# Patient Record
Sex: Female | Born: 1970 | Smoking: Never smoker
Health system: Southern US, Community
[De-identification: ages and names within clinical notes are randomized; demographics above are authoritative.]

## PROBLEM LIST (undated history)

## (undated) DIAGNOSIS — F32A Depression, unspecified: Secondary | ICD-10-CM

## (undated) DIAGNOSIS — F329 Major depressive disorder, single episode, unspecified: Secondary | ICD-10-CM

## (undated) DIAGNOSIS — F419 Anxiety disorder, unspecified: Secondary | ICD-10-CM

## (undated) DIAGNOSIS — D6851 Activated protein C resistance: Secondary | ICD-10-CM

## (undated) HISTORY — PX: BREAST SURGERY: SHX581

---

## 2018-03-07 ENCOUNTER — Other Ambulatory Visit: Payer: Self-pay | Admitting: Gastroenterology

## 2018-03-07 ENCOUNTER — Ambulatory Visit
Admission: RE | Admit: 2018-03-07 | Discharge: 2018-03-07 | Disposition: A | Discharge status: Home / Self Care | Payer: Managed Care, Other (non HMO) | Source: Ambulatory Visit | Attending: Gastroenterology | Admitting: Gastroenterology

## 2018-03-07 DIAGNOSIS — T18108A Unspecified foreign body in esophagus causing other injury, initial encounter: Secondary | ICD-10-CM

## 2018-03-07 MED ORDER — IOPAMIDOL (ISOVUE-300) INJECTION 61%
75.0000 mL | Freq: Once | INTRAVENOUS | Status: AC | PRN
  Administered 2018-03-07: 75 mL via INTRAVENOUS

## 2018-03-09 ENCOUNTER — Ambulatory Visit (HOSPITAL_COMMUNITY): Payer: Managed Care, Other (non HMO) | Admitting: Certified Registered Nurse Anesthetist

## 2018-03-09 ENCOUNTER — Ambulatory Visit (HOSPITAL_COMMUNITY)
Admission: RE | Admit: 2018-03-09 | Discharge: 2018-03-09 | Disposition: A | Discharge status: Home / Self Care | Payer: Managed Care, Other (non HMO) | Attending: Gastroenterology | Admitting: Gastroenterology

## 2018-03-09 ENCOUNTER — Encounter (HOSPITAL_COMMUNITY): Payer: Self-pay | Admitting: *Deleted

## 2018-03-09 ENCOUNTER — Encounter (HOSPITAL_COMMUNITY)
Admission: RE | Disposition: A | Discharge status: Home / Self Care | Payer: Self-pay | Source: Home / Self Care | Attending: Gastroenterology

## 2018-03-09 ENCOUNTER — Other Ambulatory Visit: Payer: Self-pay

## 2018-03-09 DIAGNOSIS — Z79899 Other long term (current) drug therapy: Secondary | ICD-10-CM | POA: Insufficient documentation

## 2018-03-09 DIAGNOSIS — X58XXXA Exposure to other specified factors, initial encounter: Secondary | ICD-10-CM | POA: Insufficient documentation

## 2018-03-09 DIAGNOSIS — K319 Disease of stomach and duodenum, unspecified: Secondary | ICD-10-CM | POA: Diagnosis not present

## 2018-03-09 DIAGNOSIS — T18128A Food in esophagus causing other injury, initial encounter: Secondary | ICD-10-CM | POA: Diagnosis present

## 2018-03-09 DIAGNOSIS — D6851 Activated protein C resistance: Secondary | ICD-10-CM | POA: Diagnosis not present

## 2018-03-09 DIAGNOSIS — F418 Other specified anxiety disorders: Secondary | ICD-10-CM | POA: Insufficient documentation

## 2018-03-09 DIAGNOSIS — K210 Gastro-esophageal reflux disease with esophagitis: Secondary | ICD-10-CM | POA: Insufficient documentation

## 2018-03-09 HISTORY — DX: Major depressive disorder, single episode, unspecified: F32.9

## 2018-03-09 HISTORY — PX: ESOPHAGOGASTRODUODENOSCOPY (EGD) WITH PROPOFOL: SHX5813

## 2018-03-09 HISTORY — PX: BIOPSY: SHX5522

## 2018-03-09 HISTORY — DX: Anxiety disorder, unspecified: F41.9

## 2018-03-09 HISTORY — DX: Activated protein C resistance: D68.51

## 2018-03-09 HISTORY — DX: Depression, unspecified: F32.A

## 2018-03-09 LAB — PREGNANCY, URINE: PREG TEST UR: NEGATIVE

## 2018-03-09 SURGERY — ESOPHAGOGASTRODUODENOSCOPY (EGD) WITH PROPOFOL
Anesthesia: Monitor Anesthesia Care

## 2018-03-09 MED ORDER — PROPOFOL 500 MG/50ML IV EMUL
INTRAVENOUS | Status: DC | PRN
  Administered 2018-03-09: 100 ug/kg/min via INTRAVENOUS

## 2018-03-09 MED ORDER — LACTATED RINGERS IV SOLN
INTRAVENOUS | Status: DC
  Administered 2018-03-09: 1000 mL via INTRAVENOUS

## 2018-03-09 MED ORDER — SODIUM CHLORIDE 0.9 % IV SOLN: INTRAVENOUS | Status: DC

## 2018-03-09 MED ORDER — PROPOFOL 10 MG/ML IV BOLUS
INTRAVENOUS | Status: DC | PRN
  Administered 2018-03-09: 40 mg via INTRAVENOUS
  Administered 2018-03-09: 30 mg via INTRAVENOUS
  Administered 2018-03-09: 20 mg via INTRAVENOUS
  Administered 2018-03-09: 40 mg via INTRAVENOUS
  Administered 2018-03-09: 30 mg via INTRAVENOUS
  Administered 2018-03-09: 40 mg via INTRAVENOUS

## 2018-03-09 MED ORDER — PROPOFOL 10 MG/ML IV BOLUS
INTRAVENOUS | Status: AC
  Filled 2018-03-09: qty 60

## 2018-03-09 SURGICAL SUPPLY — 15 items

## 2018-03-09 NOTE — Anesthesia Postprocedure Evaluation (Signed)
Anesthesia Post Note  Patient: Nicole Fitzpatrick  Procedure(s) Performed: ESOPHAGOGASTRODUODENOSCOPY (EGD) WITH PROPOFOL (N/A ) BIOPSY     Patient location during evaluation: PACU Anesthesia Type: MAC Level of consciousness: awake and alert Pain management: pain level controlled Vital Signs Assessment: post-procedure vital signs reviewed and stable Respiratory status: spontaneous breathing, nonlabored ventilation, respiratory function stable and patient connected to nasal cannula oxygen Cardiovascular status: stable and blood pressure returned to baseline Postop Assessment: no apparent nausea or vomiting Anesthetic complications: no    Last Vitals:  Vitals:   03/09/18 1234 03/09/18 1248  BP: (!) 114/53 (!) 122/52  Pulse: 84 76  Resp: 20 17  Temp: 36.5 C   SpO2: 100% 100%    Last Pain:  Vitals:   03/09/18 1248  TempSrc:   PainSc: 0-No pain                 Effie Berkshire

## 2018-03-09 NOTE — Transfer of Care (Signed)
Immediate Anesthesia Transfer of Care Note  Patient: Nicole Fitzpatrick  Procedure(s) Performed: ESOPHAGOGASTRODUODENOSCOPY (EGD) WITH PROPOFOL (N/A ) BIOPSY  Patient Location: Endoscopy Unit  Anesthesia Type:MAC  Level of Consciousness: drowsy and patient cooperative  Airway & Oxygen Therapy: Patient Spontanous Breathing and Patient connected to nasal cannula oxygen  Post-op Assessment: Report given to RN and Post -op Vital signs reviewed and stable  Post vital signs: Reviewed and stable  Last Vitals:  Vitals Value Taken Time  BP    Temp    Pulse    Resp 27 03/09/2018 12:32 PM  SpO2    Vitals shown include unvalidated device data.  Last Pain:  Vitals:   03/09/18 1050  TempSrc: Oral  PainSc: 0-No pain         Complications: No apparent anesthesia complications

## 2018-03-09 NOTE — Discharge Instructions (Signed)

## 2018-03-09 NOTE — Anesthesia Preprocedure Evaluation (Addendum)
Anesthesia Evaluation  Patient identified by MRN, date of birth, ID band Patient awake    Reviewed: Allergy & Precautions, NPO status , Patient's Chart, lab work & pertinent test results  Airway Mallampati: I  TM Distance: >3 FB Neck ROM: Full    Dental  (+) Teeth Intact, Dental Advisory Given   Pulmonary neg pulmonary ROS,    breath sounds clear to auscultation       Cardiovascular negative cardio ROS   Rhythm:Regular Rate:Normal     Neuro/Psych Anxiety Depression negative neurological ROS     GI/Hepatic Neg liver ROS, GERD  ,  Endo/Other  negative endocrine ROS  Renal/GU negative Renal ROS     Musculoskeletal negative musculoskeletal ROS (+)   Abdominal Normal abdominal exam  (+)   Peds  Hematology negative hematology ROS (+)   Anesthesia Other Findings   Reproductive/Obstetrics                            Anesthesia Physical Anesthesia Plan  ASA: II  Anesthesia Plan: MAC   Post-op Pain Management:    Induction: Intravenous  PONV Risk Score and Plan: 0 and Propofol infusion  Airway Management Planned: Natural Airway and Nasal Cannula  Additional Equipment: None  Intra-op Plan:   Post-operative Plan:   Informed Consent:   Plan Discussed with: CRNA  Anesthesia Plan Comments:         Anesthesia Quick Evaluation

## 2018-03-09 NOTE — Op Note (Signed)
[facility] Patient Name: Nicole Fitzpatrick Procedure Date: 03/09/2018 MRN: [id number] Attending MD: [name] , MD Date of Birth: 1970/09/06 CSN: [id number] Age: 48 Admit Type: Outpatient Procedure:                Upper GI endoscopy Indications:              Foreign body in the esophagus, abdominal bloating,                            GERD Providers:                [name], MD, Margaree Mackintosh, RN,                            Beryle Beams, Technician, Milinda Cave, CRNA Referring MD:              Medicines:                Sedation Administered by an Anesthesia Professional Complications:            No immediate complications. Estimated Blood Loss:     Estimated blood loss was minimal. Procedure:                Pre-Anesthesia Assessment:                           - Prior to the procedure, a History and Physical                            was performed, and patient medications and                            allergies were reviewed. The patient's tolerance of                            previous anesthesia was also reviewed. The risks                            and benefits of the procedure and the sedation                            options and risks were discussed with the patient.                            All questions were answered, and informed consent                            was obtained. Prior Anticoagulants: The patient has                            taken no previous anticoagulant or antiplatelet                            agents. ASA Grade Assessment: II - A patient with  mild systemic disease. After reviewing the risks                            and benefits, the patient was deemed in                            satisfactory condition to undergo the procedure.                           After obtaining informed consent, the endoscope was                            passed under direct vision. Throughout the                         procedure, the patient's blood pressure, pulse, and                            oxygen saturations were monitored continuously. The                            GIF-H190 ([id number] Olympus gastroscope was                            introduced through the mouth, and advanced to the                            second part of duodenum. The upper GI endoscopy was                            accomplished without difficulty. The patient                            tolerated the procedure well. Scope In: Scope Out: Findings:      The examined portions of the oropharynx and larynx were normal.      There is no endoscopic evidence of Foreign body in the entire esophagus.      The Z-line was regular and was found 39 cm from the incisors.      Normal mucosa was found in the entire esophagus. Biopsies were obtained       from the proximal and distal esophagus with cold forceps for histology       of suspected eosinophilic esophagitis.      Normal mucosa was found in the entire examined stomach. Biopsies were       taken with a cold forceps for histology.      The cardia and gastric fundus were normal on retroflexion.      The duodenal bulb, first portion of the duodenum and second portion of       the duodenum were normal. Biopsies for histology were taken with a cold       forceps for evaluation of celiac disease. Impression:               - The examined portions of the nasopharynx,  oropharynx and larynx were normal.                           - Z-line regular, 39 cm from the incisors.                           - Normal mucosa was found in the entire esophagus.                            Biopsied.                           - Normal mucosa was found in the entire stomach.                            Biopsied.                           - Normal duodenal bulb, first portion of the                            duodenum and second portion of the duodenum.                             Biopsied. Moderate Sedation:      Moderate (conscious) sedation was personally administered by an       anesthesia professional. The following parameters were monitored: oxygen       saturation, heart rate, blood pressure, and response to care. Recommendation:           - Patient has a contact number available for                            emergencies. The signs and symptoms of potential                            delayed complications were discussed with the                            patient. Return to normal activities tomorrow.                            Written discharge instructions were provided to the                            patient.                           - Resume previous diet.                           - Continue present medications.                           - Await pathology results.                           -  Return to GI clinic PRN. Procedure Code(s):        --- Professional ---                           [phone], Esophagogastroduodenoscopy, flexible,                            transoral; with biopsy, single or multiple Diagnosis Code(s):        --- Professional ---                           T18.108A, Unspecified foreign body in esophagus                            causing other injury, initial encounter CPT copyright 2018 American Medical Association. All rights reserved. The codes documented in this report are preliminary and upon coder review may  be revised to meet current compliance requirements. [name], MD [name], MD 03/09/2018 12:31:49 PM Number of Addenda: 0

## 2018-03-09 NOTE — H&P (Signed)
Nicole Fitzpatrick is a 48 y.o. female was seen in the office by Dr. Randa Evens for possible foreign body in the esophagus.  CT neck was negative for any foreign body or any other abnormality.  She is here to undergo EGD for further evaluation.  Please see  full H&P from office note for details.  The various methods of treatment have been discussed with the patient and family. After consideration of risks, benefits and other options for treatment, the patient has consented to  Procedure(s): Esophago-duodenoscopy  as a surgical intervention .  The patient's history has been reviewed, patient examined, no change in status, stable for surgery.  I have reviewed the patient's chart and labs.  Questions were answered to the patient's satisfaction.   Risks (bleeding, infection, bowel perforation that could require surgery, sedation-related changes in cardiopulmonary systems), benefits (identification and possible treatment of source of symptoms, exclusion of certain causes of symptoms), and alternatives (watchful waiting, radiographic imaging studies, empiric medical treatment)  were explained to patien in detail and patient wishes to proceed.   Nicole Der MD, FACP 03/09/2018, 10:40 AM  Contact #  [phone]

## 2018-03-12 ENCOUNTER — Encounter (HOSPITAL_COMMUNITY): Payer: Self-pay | Admitting: Gastroenterology

## 2018-03-27 ENCOUNTER — Ambulatory Visit: Payer: Self-pay | Admitting: Family Medicine

## 2019-01-05 ENCOUNTER — Other Ambulatory Visit: Payer: Self-pay

## 2019-01-05 ENCOUNTER — Ambulatory Visit
Admission: EM | Admit: 2019-01-05 | Discharge: 2019-01-05 | Disposition: A | Discharge status: Home / Self Care | Payer: Managed Care, Other (non HMO) | Attending: Emergency Medicine | Admitting: Emergency Medicine

## 2019-01-05 ENCOUNTER — Encounter: Payer: Self-pay | Admitting: Emergency Medicine

## 2019-01-05 DIAGNOSIS — Z20828 Contact with and (suspected) exposure to other viral communicable diseases: Secondary | ICD-10-CM

## 2019-01-05 DIAGNOSIS — R519 Headache, unspecified: Secondary | ICD-10-CM | POA: Diagnosis not present

## 2019-01-05 DIAGNOSIS — R5383 Other fatigue: Secondary | ICD-10-CM

## 2019-01-05 DIAGNOSIS — Z20822 Contact with and (suspected) exposure to covid-19: Secondary | ICD-10-CM

## 2019-01-05 MED ORDER — DEXAMETHASONE SODIUM PHOSPHATE 10 MG/ML IJ SOLN
10.0000 mg | Freq: Once | INTRAMUSCULAR | Status: AC
  Administered 2019-01-05: 14:00:00 10 mg via INTRAMUSCULAR

## 2019-01-05 MED ORDER — KETOROLAC TROMETHAMINE 30 MG/ML IJ SOLN
30.0000 mg | Freq: Once | INTRAMUSCULAR | Status: AC
  Administered 2019-01-05: 14:00:00 30 mg via INTRAMUSCULAR

## 2019-01-05 NOTE — ED Triage Notes (Signed)
Pt presents to [facility] for assessment of generalized fatigue, headaches that weren't relieved with motrin.  Pt c/o "heaviness" in throat and chest.  Patient states she also found out today she had a COVID exposure at work this week, unsure of who, not without a mask.

## 2019-01-05 NOTE — ED Notes (Signed)
Patient able to ambulate independently  

## 2019-01-05 NOTE — Discharge Instructions (Signed)
Your COVID test is pending - it is important to quarantine / isolate at home until your results are back. °If you test positive and would like further evaluation for persistent or worsening symptoms, you may schedule an E-visit or virtual (video) visit throughout the [facility] MyChart app or website. ° °PLEASE NOTE: If you develop severe chest pain or shortness of breath please go to the ER or call 9-1-1 for further evaluation --> DO NOT schedule electronic or virtual visits for this. °Please call our office for further guidance / recommendations as needed. °

## 2019-01-05 NOTE — ED Provider Notes (Signed)
[facility]    CSN: [id number] Arrival date & time: 01/05/19  1318      History   Chief Complaint Chief Complaint  Patient presents with  . Fatigue    HPI Billie RuddyMelanie Gonnella is a 48 y.o. 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  Patient has tried ibuprofen for headache without significant relief.  States is frontal, pressure-like, feels like her normal.  No change in vision, facial weakness, nausea, vomiting.   Past Medical History:  Diagnosis Date  . Anxiety   . Depression   . Factor V Leiden (HCC)     There are no problems to display for this patient.   Past Surgical History:  Procedure Laterality Date  . BIOPSY  03/09/2018   Procedure: BIOPSY;  Surgeon: Kathi DerBrahmbhatt, Parag, MD;  Location: [facility];  Service: Gastroenterology;;  . BREAST SURGERY Bilateral    Aumentation  . CESAREAN SECTION    . ESOPHAGOGASTRODUODENOSCOPY (EGD) WITH PROPOFOL N/A 03/09/2018   Procedure: ESOPHAGOGASTRODUODENOSCOPY (EGD) WITH PROPOFOL;  Surgeon: Kathi DerBrahmbhatt, Parag, MD;  Location: [facility];  Service: Gastroenterology;  Laterality: N/A;    OB History   No obstetric history on file.      Home Medications    Prior to Admission medications   Medication Sig Start Date End Date Taking? Authorizing Provider  ibuprofen (ADVIL,MOTRIN) 200 MG tablet Take 400 mg by mouth every 6 (six) hours as needed.    [provider]  pantoprazole (PROTONIX) 40 MG tablet Take 40 mg by mouth daily.    [provider]  Probiotic Product (PROBIOTIC DAILY PO) Take 1 capsule by mouth daily.    [provider]  sertraline (ZOLOFT) 50 MG tablet Take 50 mg by mouth daily.     [provider]    Family History Family History  Problem Relation Age of Onset  . Breast cancer Mother   . Thyroid cancer Mother   . Hypertension Mother   . Leukemia Father     Social History Social History   Tobacco Use  . Smoking status: Never Smoker  . Smokeless tobacco: Never Used  Substance Use Topics  . Alcohol use: Yes    Comment: occasionally  . Drug use: Never     Allergies   Sulfa antibiotics   Review of Systems Review of Systems  Constitutional: Positive for fatigue. Negative for fever.  HENT: Positive for sore throat. Negative for ear pain, sinus pain and voice change.   Eyes: Negative for pain, redness and visual disturbance.  Respiratory: Negative for cough and shortness of breath.   Cardiovascular: Negative for chest pain and palpitations.  Gastrointestinal: Negative for abdominal pain, diarrhea and vomiting.  Musculoskeletal: Negative for arthralgias and myalgias.  Skin: Negative for rash and wound.  Neurological: Positive for headaches. Negative for dizziness, syncope, facial asymmetry and light-headedness.     Physical Exam Triage Vital Signs ED Triage Vitals  Enc Vitals Group     BP      Pulse      Resp      Temp      Temp src      SpO2      Weight      Height      Head Circumference  Peak Flow      Pain Score      Pain Loc      Pain Edu?      Excl. in GC?    No data found.  Updated Vital Signs BP 139/84 (BP Location: Left Arm)   Pulse 84   Temp 98.1 F (36.7 C) (Temporal)   Resp 16   LMP 12/24/2018   SpO2 97%   Visual Acuity Right Eye Distance:   Left Eye Distance:   Bilateral Distance:    Right Eye Near:   Left Eye Near:    Bilateral Near:     Physical Exam Constitutional:      General: She is not in acute distress.    Appearance: She is obese. She is not ill-appearing.  HENT:     Head: Normocephalic and atraumatic.     Mouth/Throat:     Mouth: Mucous membranes are moist.     Pharynx:  Oropharynx is clear. No oropharyngeal exudate or posterior oropharyngeal erythema.     Comments: Uvula midline, nonedematous Eyes:     General: No scleral icterus.    Conjunctiva/sclera: Conjunctivae normal.     Pupils: Pupils are equal, round, and reactive to light.  Cardiovascular:     Rate and Rhythm: Normal rate.  Pulmonary:     Effort: Pulmonary effort is normal. No respiratory distress.     Breath sounds: No wheezing.  Musculoskeletal:     Cervical back: Normal range of motion and neck supple.  Lymphadenopathy:     Cervical: No cervical adenopathy.  Skin:    Capillary Refill: Capillary refill takes less than 2 seconds.     Coloration: Skin is not jaundiced or pale.  Neurological:     General: No focal deficit present.     Mental Status: She is alert and oriented to person, place, and time.      UC Treatments / Results  Labs (all labs ordered are listed, but only abnormal results are displayed) Labs Reviewed  NOVEL CORONAVIRUS, NAA    EKG   Radiology No results found.  Procedures Procedures (including critical care time)  Medications Ordered in UC Medications  dexamethasone (DECADRON) injection 10 mg (10 mg Intramuscular Given 01/05/19 1355)  ketorolac (TORADOL) 30 MG/ML injection 30 mg (30 mg Intramuscular Given 01/05/19 1355)    Initial Impression / Assessment and Plan / UC Course  I have reviewed the triage vital signs and the nursing notes.  Pertinent labs & imaging results that were available during my care of the patient were reviewed by me and considered in my medical decision making (see chart for details).     Patient afebrile, nontoxic, with SpO2 97%.  No neurocognitive deficits on exam, low concern for acute bleed at this time: Patient given IM Solu-Medrol and Toradol in office which she tolerated well for her headache.  Covid PCR pending.  Patient to quarantine until results are back.  We will continue supportive management.  Return precautions  discussed, patient verbalized understanding and is agreeable to plan. Final Clinical Impressions(s) / UC Diagnoses   Final diagnoses:  Exposure to COVID-19 virus  Fatigue, unspecified type     Discharge Instructions     Your COVID test is pending - it is important to quarantine / isolate at home until your results are back. If you test positive and would like further evaluation for persistent or worsening symptoms, you may schedule an E-visit or virtual (video) visit throughout the [facility] app or website.  PLEASE NOTE: If you develop severe chest pain or shortness of breath please go to the ER or call 9-1-1 for further evaluation --> DO NOT schedule electronic or virtual visits for this. Please call our office for further guidance / recommendations as needed.    ED Prescriptions    None     PDMP not reviewed this encounter.   Hall[place]vin, [place], [place] 01/05/19 1408

## 2019-01-06 LAB — NOVEL CORONAVIRUS, NAA: SARS-CoV-2, NAA: NOT DETECTED

## 2019-12-04 IMAGING — CT CT NECK W/ CM
3 of 12 series · 11 of 33 positions shown, 13 images · IV contrast (iopamidol)
Comparison: None.

CLINICAL DATA: Possible swallowed foreign body (chicken bone) 1
week ago with continued neck pain.

EXAM:
CT NECK WITH CONTRAST
TECHNIQUE: Multidetector CT imaging of the neck was performed using the
standard protocol following the bolus administration of intravenous
contrast.
CONTRAST:  75mL AR2UGI-3KK IOPAMIDOL (AR2UGI-3KK) INJECTION 61%

[Series 12: neck 2.00 br36 s3 (person_name) · axial · 0.46mm/px · z∈[-670,-522]mm · 3 of 154 slices shown, 4 images]
[im 1/154  soft-tissue]
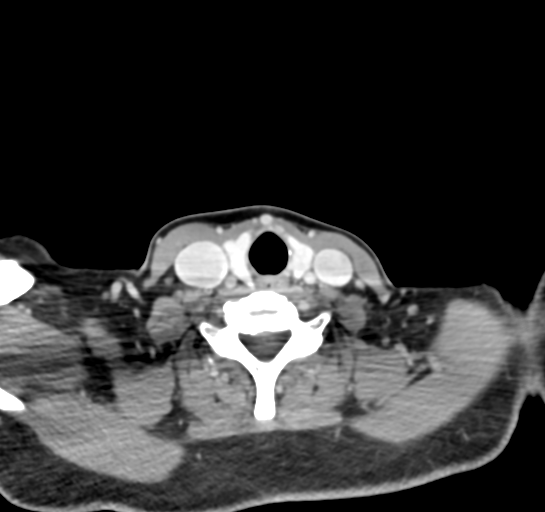
[im 1/154  bone]
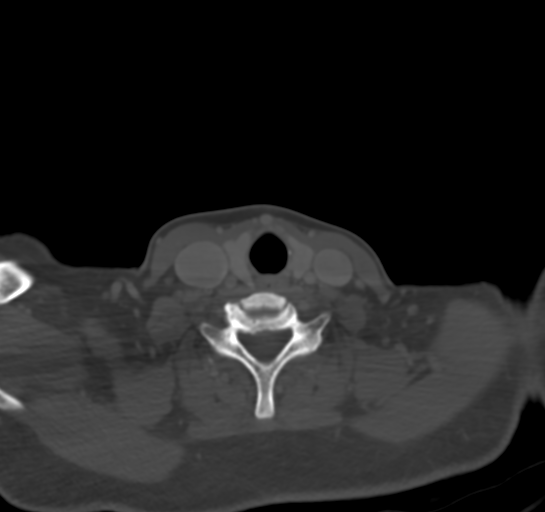
[im 77/154  bone]
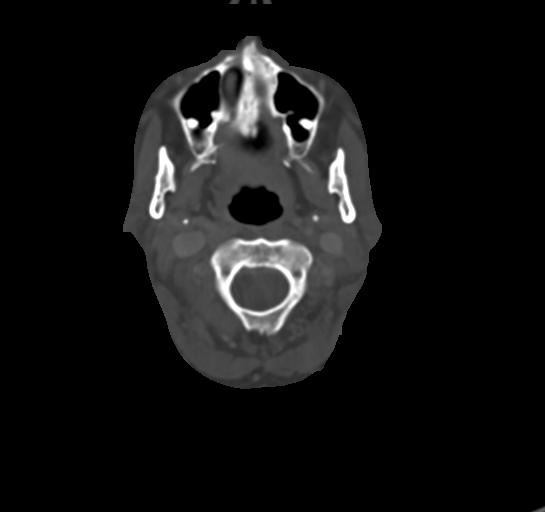
[im 154/154  bone]
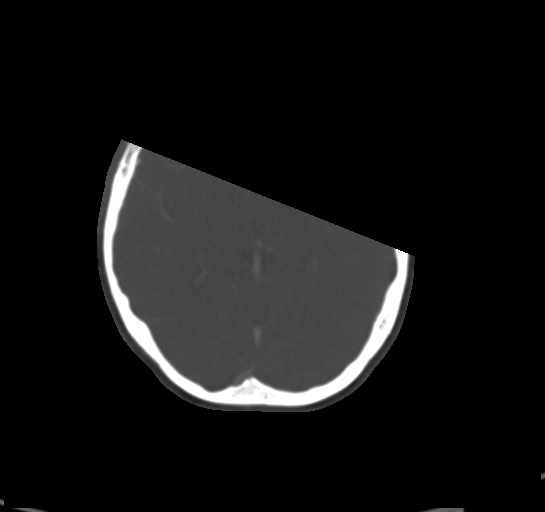

[Series 22: neck 2.00 br40 s3 (person_name) · coronal · 0.35mm/px · 3 of 126 slices shown]
[im 26/126  bone]
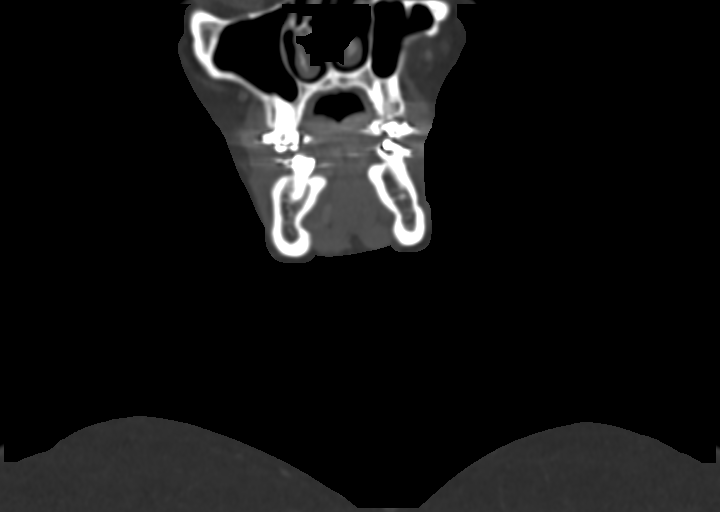
[im 51/126  bone]
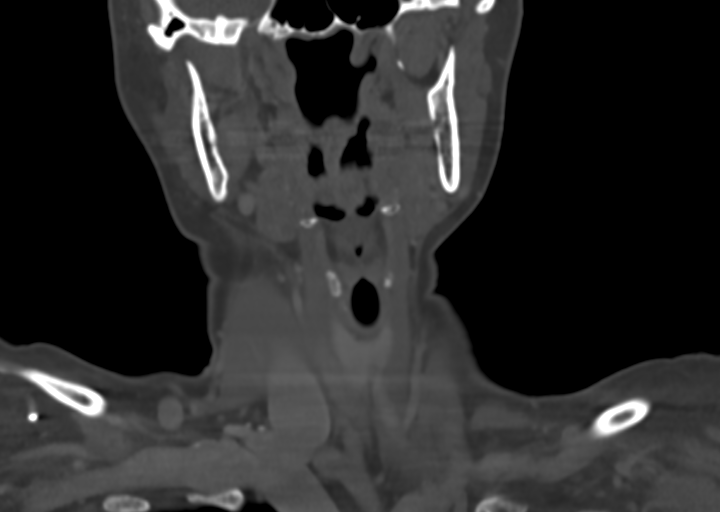
[im 76/126  bone]
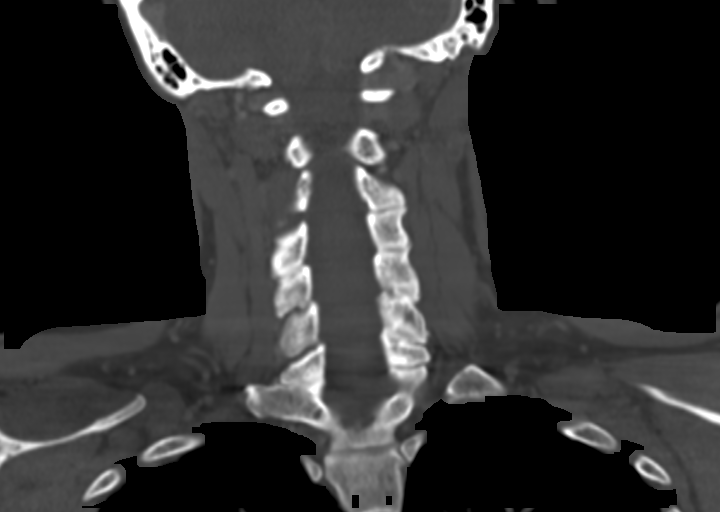

[Series 24: neck 2.00 br40 s3 sag st · sagittal · 0.35mm/px · 5 of 126 slices shown, 6 images]
[im 42/126  bone]
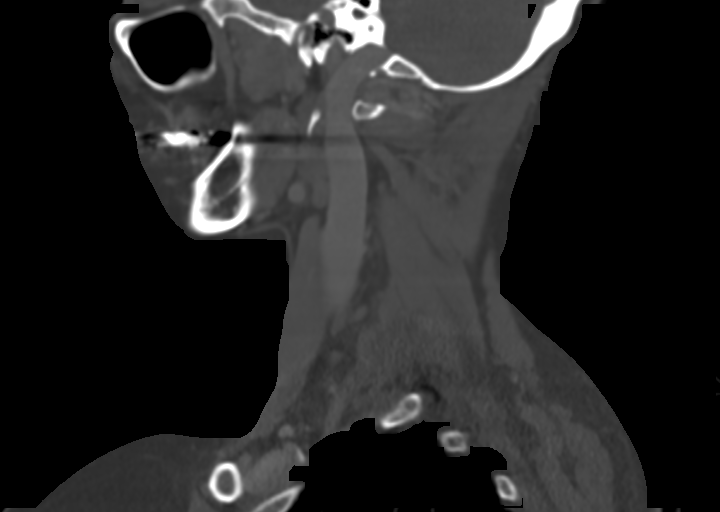
[im 53/126  bone]
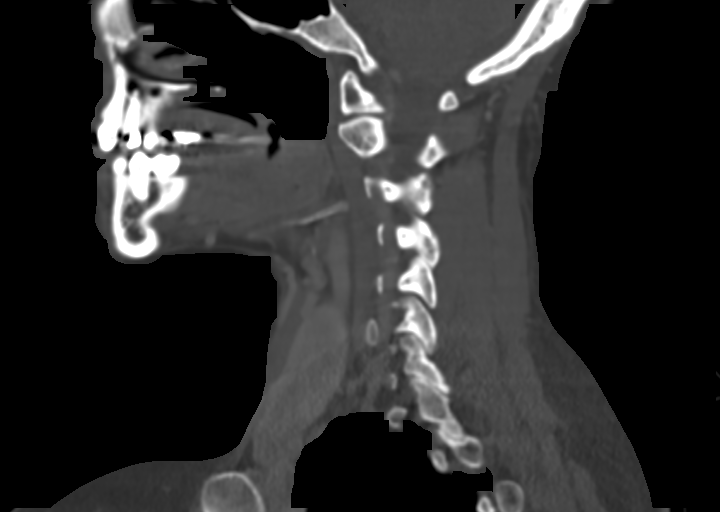
[im 63/126  soft-tissue]
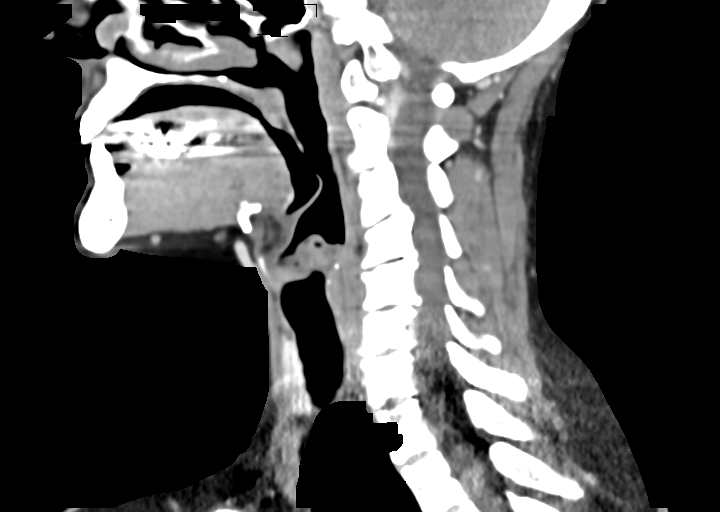
[im 63/126  bone]
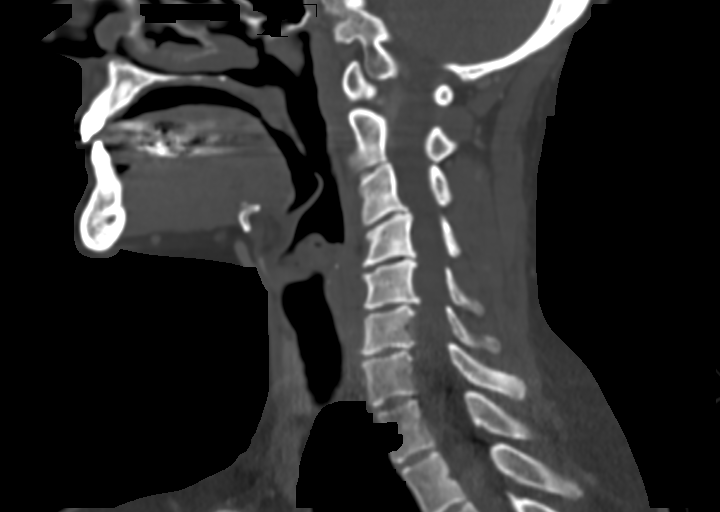
[im 73/126  bone]
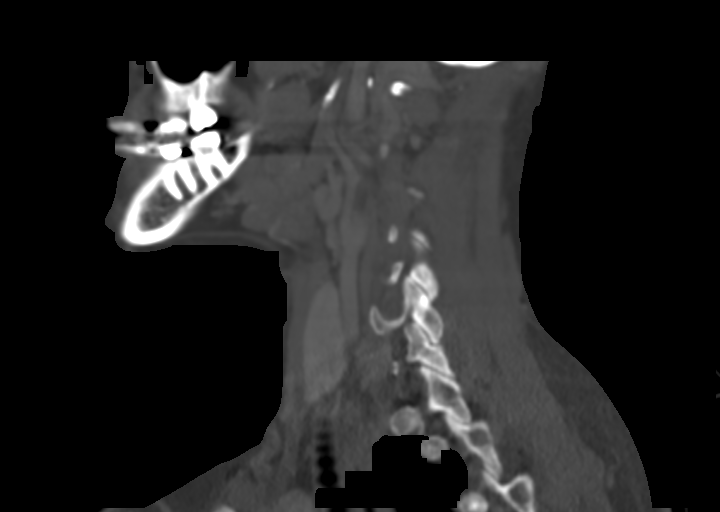
[im 84/126  bone]
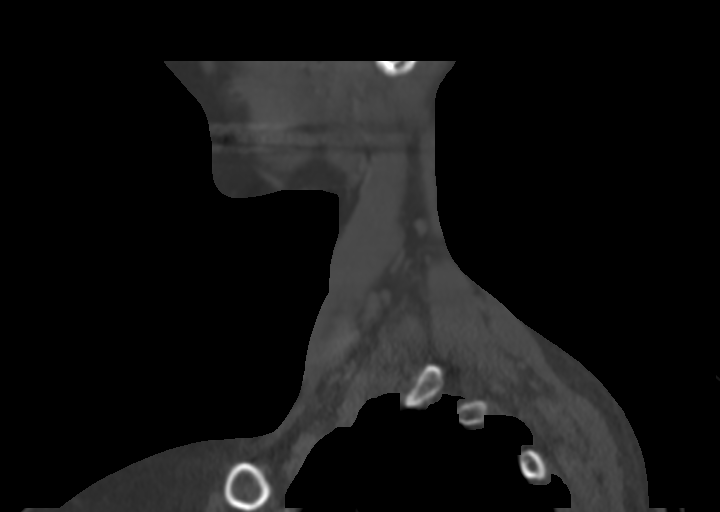

[11 of 33 positions shown; findings below may reference images not displayed]

FINDINGS: Pharynx and larynx: No evidence of mass, swelling, or foreign body.
No fluid collection or significant inflammatory changes in the
parapharyngeal or retropharyngeal spaces.

Salivary glands: No inflammation, mass, or stone.

Thyroid: Unremarkable.

Lymph nodes: No enlarged or suspicious lymph nodes in the neck.

Vascular: Major vascular structures of the neck are patent.

Limited intracranial: Unremarkable.

Visualized orbits: Unremarkable.

Mastoids and visualized paranasal sinuses: Clear.

Skeleton: Disc degeneration in the mid and lower cervical spine with
uncovertebral spurring resulting in multilevel neural foraminal
stenosis, severe at C5-6 and C6-7.

Upper chest: Clear lung apices. Partially visualized breast
implants.

Other: None.
IMPRESSION: No foreign body or other acute abnormality identified in the neck.

## 2020-06-24 ENCOUNTER — Other Ambulatory Visit: Payer: Self-pay | Admitting: Physician Assistant

## 2020-06-24 DIAGNOSIS — R1030 Lower abdominal pain, unspecified: Secondary | ICD-10-CM

## 2020-07-09 ENCOUNTER — Ambulatory Visit
Admission: RE | Admit: 2020-07-09 | Discharge: 2020-07-09 | Disposition: A | Discharge status: Home / Self Care | Payer: Managed Care, Other (non HMO) | Source: Ambulatory Visit | Attending: Physician Assistant | Admitting: Physician Assistant

## 2020-07-09 DIAGNOSIS — R1030 Lower abdominal pain, unspecified: Secondary | ICD-10-CM

## 2020-07-09 MED ORDER — IOPAMIDOL (ISOVUE-300) INJECTION 61%
100.0000 mL | Freq: Once | INTRAVENOUS | Status: AC | PRN
  Administered 2020-07-09: 100 mL via INTRAVENOUS

## 2020-07-21 ENCOUNTER — Other Ambulatory Visit: Payer: Self-pay | Admitting: Nurse Practitioner

## 2020-07-21 DIAGNOSIS — N63 Unspecified lump in unspecified breast: Secondary | ICD-10-CM
